# Patient Record
Sex: Female | Born: 1955 | Race: Black or African American | Hispanic: No
Health system: Southern US, Community
[De-identification: ages and names within clinical notes are randomized; demographics above are authoritative.]

## PROBLEM LIST (undated history)

## (undated) DIAGNOSIS — N289 Disorder of kidney and ureter, unspecified: Secondary | ICD-10-CM

## (undated) DIAGNOSIS — I10 Essential (primary) hypertension: Secondary | ICD-10-CM

## (undated) HISTORY — PX: BACK SURGERY: SHX140

---

## 2016-02-21 ENCOUNTER — Other Ambulatory Visit (HOSPITAL_COMMUNITY)
Admission: RE | Admit: 2016-02-21 | Discharge: 2016-02-21 | Disposition: A | Discharge status: Home / Self Care | Payer: Medicare Other | Source: Ambulatory Visit | Attending: Pathology | Admitting: Pathology

## 2016-02-21 DIAGNOSIS — Z029 Encounter for administrative examinations, unspecified: Secondary | ICD-10-CM | POA: Diagnosis present

## 2016-02-22 LAB — HEPATITIS B SURFACE ANTIGEN: Hepatitis B Surface Ag: NEGATIVE

## 2017-05-20 ENCOUNTER — Emergency Department (HOSPITAL_COMMUNITY): Payer: Medicare Other

## 2017-05-20 ENCOUNTER — Emergency Department (HOSPITAL_COMMUNITY)
Admission: EM | Admit: 2017-05-20 | Discharge: 2017-05-21 | Disposition: A | Discharge status: Home / Self Care | Payer: Medicare Other | Attending: Emergency Medicine | Admitting: Emergency Medicine

## 2017-05-20 ENCOUNTER — Encounter (HOSPITAL_COMMUNITY): Payer: Self-pay | Admitting: *Deleted

## 2017-05-20 DIAGNOSIS — R0602 Shortness of breath: Secondary | ICD-10-CM | POA: Diagnosis not present

## 2017-05-20 DIAGNOSIS — Z79899 Other long term (current) drug therapy: Secondary | ICD-10-CM | POA: Insufficient documentation

## 2017-05-20 DIAGNOSIS — R05 Cough: Secondary | ICD-10-CM | POA: Diagnosis not present

## 2017-05-20 DIAGNOSIS — I10 Essential (primary) hypertension: Secondary | ICD-10-CM | POA: Diagnosis not present

## 2017-05-20 DIAGNOSIS — R0789 Other chest pain: Secondary | ICD-10-CM

## 2017-05-20 DIAGNOSIS — R059 Cough, unspecified: Secondary | ICD-10-CM

## 2017-05-20 DIAGNOSIS — R1011 Right upper quadrant pain: Secondary | ICD-10-CM | POA: Diagnosis present

## 2017-05-20 HISTORY — DX: Disorder of kidney and ureter, unspecified: N28.9

## 2017-05-20 HISTORY — DX: Essential (primary) hypertension: I10

## 2017-05-20 LAB — COMPREHENSIVE METABOLIC PANEL
ALBUMIN: 3.4 g/dL — AB (ref 3.5–5.0)
ALT: 15 U/L (ref 14–54)
ANION GAP: 12 (ref 5–15)
AST: 25 U/L (ref 15–41)
Alkaline Phosphatase: 100 U/L (ref 38–126)
BUN: 27 mg/dL — AB (ref 6–20)
CO2: 30 mmol/L (ref 22–32)
Calcium: 7.9 mg/dL — ABNORMAL LOW (ref 8.9–10.3)
Chloride: 99 mmol/L — ABNORMAL LOW (ref 101–111)
Creatinine, Ser: 6.79 mg/dL — ABNORMAL HIGH (ref 0.44–1.00)
GFR calc Af Amer: 7 mL/min — ABNORMAL LOW (ref >60)
GFR calc non Af Amer: 6 mL/min — ABNORMAL LOW (ref >60)
GLUCOSE: 90 mg/dL (ref 65–99)
POTASSIUM: 4.1 mmol/L (ref 3.5–5.1)
SODIUM: 141 mmol/L (ref 135–145)
TOTAL PROTEIN: 6.7 g/dL (ref 6.5–8.1)
Total Bilirubin: 0.6 mg/dL (ref 0.3–1.2)

## 2017-05-20 LAB — LIPASE, BLOOD: LIPASE: 43 U/L (ref 11–51)

## 2017-05-20 LAB — CBC
HEMATOCRIT: 36.4 % (ref 36.0–46.0)
Hemoglobin: 11.4 g/dL — ABNORMAL LOW (ref 12.0–15.0)
MCH: 30.6 pg (ref 26.0–34.0)
MCHC: 31.3 g/dL (ref 30.0–36.0)
MCV: 97.8 fL (ref 78.0–100.0)
Platelets: 112 10*3/uL — ABNORMAL LOW (ref 150–400)
RBC: 3.72 MIL/uL — ABNORMAL LOW (ref 3.87–5.11)
RDW: 15.6 % — ABNORMAL HIGH (ref 11.5–15.5)
WBC: 3.9 10*3/uL — AB (ref 4.0–10.5)

## 2017-05-20 NOTE — ED Triage Notes (Addendum)
To ED for eval of right upper abd pain for the past month. Started intermittent and now constant. States she can't take a deep breath due to pain and sometimes the area feels swollen. Pt is MWF dialysis pt. She is visiting here from [facility]

## 2017-05-20 NOTE — ED Notes (Signed)
Patient transported to [facility] 

## 2017-05-20 NOTE — ED Provider Notes (Signed)
[facility]-EMERGENCY DEPT Provider Note   CSN: [id number] Arrival date & time: 05/20/17  1459     History   Chief Complaint Chief Complaint  Patient presents with  . Abdominal Pain    HPI Lynn Webb is a 61 y.o. female.  Lynn Webb is a 61 y.o. Female who presents to the emergency room complaining of right upper quadrant abdominal pain ongoing for the past month. Patient reports initially she had intermittent right upper quadrant abdominal pain and now it is constant. She reports that the area feels swollen and it hurts when she is coughing. She does reports she's had some increased coughing recently. She reports sometimes it hurts worse when she eats and sometimes it does not. She reports she had some nausea, vomiting and diarrhea about 3 days ago that resolved. She denies previous abdominal surgeries. She reports at times she feels short of breath and then this resolves. She denies any current shortness of breath. She does not feel short of breath with exertion. She denies fevers. She is a dialysis patient receives dialysis Monday, Wednesday and Friday. Last dialysis was yesterday. She is visiting from [place]. She denies fevers, vomiting, diarrhea, rashes, current shortness of breath, chest pain, palpitations, hemoptysis. She makes no urine.    The history is provided by the patient and medical records. No language interpreter was used.  Abdominal Pain   Pertinent negatives include fever, diarrhea, nausea, vomiting, constipation and headaches.    Past Medical History:  Diagnosis Date  . Hypertension   . Renal disorder     There are no active problems to display for this patient.   Past Surgical History:  Procedure Laterality Date  . BACK SURGERY      OB History    No data available       Home Medications    Prior to Admission medications   Medication Sig Start Date End Date Taking? Authorizing Provider  benzonatate (TESSALON) 100 MG capsule Take 100  mg by mouth 3 (three) times daily as needed for cough.   Yes [provider]  carvedilol (COREG) 3.125 MG tablet Take 3.125 mg by mouth 2 (two) times daily with a meal.   Yes [provider]  cinacalcet (SENSIPAR) 60 MG tablet Take 60 mg by mouth daily.   Yes [provider]  folic acid-vitamin b complex-vitamin c-selenium-zinc (DIALYVITE) 3 MG TABS tablet Take 1 tablet by mouth daily.   Yes [provider]  losartan (COZAAR) 100 MG tablet Take 100 mg by mouth daily.   Yes [provider]  omeprazole (PRILOSEC) 20 MG capsule Take 20 mg by mouth daily.   Yes [provider]  ondansetron (ZOFRAN-ODT) 4 MG disintegrating tablet Take 4 mg by mouth 2 (two) times daily as needed for nausea or vomiting.   Yes [provider]  ranitidine (ZANTAC) 150 MG tablet Take 150 mg by mouth at bedtime.   Yes [provider]    Family History No family history on file.  Social History Social History  Substance Use Topics  . Smoking status: Not on file  . Smokeless tobacco: Not on file  . Alcohol use No     Allergies   Patient has no known allergies.   Review of Systems Review of Systems  Constitutional: Negative for chills and fever.  HENT: Negative for congestion and sore throat.   Eyes: Negative for visual disturbance.  Respiratory: Positive for shortness of breath. Negative for cough and wheezing.  Cardiovascular: Negative for chest pain and palpitations.  Gastrointestinal: Positive for abdominal pain. Negative for blood in stool, constipation, diarrhea, nausea and vomiting.  Genitourinary:       Makes no urine  Musculoskeletal: Negative for back pain and neck pain.  Skin: Negative for rash.  Neurological: Negative for weakness, numbness and headaches.     Physical Exam Updated Vital Signs BP (!) 144/74   Pulse 72   Temp 98.6 F (37 C) (Oral)   Resp (!) 22   SpO2 100%   Physical Exam  Constitutional: She  appears well-developed and well-nourished. No distress.  Nontoxic appearing.  HENT:  Head: Normocephalic and atraumatic.  Mouth/Throat: Oropharynx is clear and moist.  Eyes: Pupils are equal, round, and reactive to light. Conjunctivae are normal. Right eye exhibits no discharge. Left eye exhibits no discharge.  Neck: Neck supple.  Cardiovascular: Normal rate, regular rhythm, normal heart sounds and intact distal pulses.  Exam reveals no gallop and no friction rub.   No murmur heard. Pulmonary/Chest: Effort normal and breath sounds normal. No respiratory distress. She has no wheezes. She has no rales.  Lungs are clear to ascultation bilaterally. Symmetric chest expansion bilaterally. No increased work of breathing. No rales or rhonchi.    Abdominal: Soft. Bowel sounds are normal. She exhibits no distension and no mass. There is tenderness. There is no rebound.  Abdomen is soft. Bowel sounds are present. Patient has right upper quadrant tenderness to palpation on exam. Positive Murphy sign.  Musculoskeletal: She exhibits no edema.  Lymphadenopathy:    She has no cervical adenopathy.  Neurological: She is alert. Coordination normal.  Skin: Skin is warm and dry. Capillary refill takes less than 2 seconds. No rash noted. She is not diaphoretic. No erythema. No pallor.  Psychiatric: She has a normal mood and affect. Her behavior is normal.  Nursing note and vitals reviewed.    ED Treatments / Results  Labs (all labs ordered are listed, but only abnormal results are displayed) Labs Reviewed  COMPREHENSIVE METABOLIC PANEL - Abnormal; Notable for the following:       Result Value   Chloride 99 (*)    BUN 27 (*)    Creatinine, Ser 6.79 (*)    Calcium 7.9 (*)    Albumin 3.4 (*)    GFR calc non Af Amer 6 (*)    GFR calc Af Amer 7 (*)    All other components within normal limits  CBC - Abnormal; Notable for the following:    WBC 3.9 (*)    RBC 3.72 (*)    Hemoglobin 11.4 (*)    RDW 15.6  (*)    Platelets 112 (*)    All other components within normal limits  D-DIMER, QUANTITATIVE (NOT AT [facility]) - Abnormal; Notable for the following:    D-Dimer, Quant 2.32 (*)    All other components within normal limits  LIPASE, BLOOD  I-STAT TROPONIN, ED    EKG  EKG Interpretation  Date/Time:  Tuesday May 20 2017 15:30:39 EDT Ventricular Rate:  76 PR Interval:  158 QRS Duration: 84 QT Interval:  440 QTC Calculation: 495 R Axis:   76 Text Interpretation:  Normal sinus rhythm Biatrial enlargement ST & T wave abnormality, consider inferolateral ischemia Prolonged QT Abnormal ECG no previous EKG  Confirmed by Crista Curb [phone]) on 05/20/2017 3:47:32 PM       Radiology Dg Chest 2 View  Result Date: 05/20/2017 CLINICAL DATA:  Right upper abdominal pain for a  month. Difficulty taking a deep breath due to pain. History of dialysis and hypertension. Nonsmoker. EXAM: CHEST  2 VIEW COMPARISON:  None. FINDINGS: Cardiac enlargement. No vascular congestion or edema. No focal consolidation. No blunting of costophrenic angles. No pneumothorax. Calcification of the aorta. Degenerative changes in the shoulders. IMPRESSION: Cardiac enlargement.  No evidence of active pulmonary disease. Electronically Signed   By: Burman Nieves M.D.   On: 05/20/2017 23:48   US Abdomen Limited Ruq  Result Date: 05/20/2017 CLINICAL DATA:  Right upper quadrant abdominal pain EXAM: ULTRASOUND ABDOMEN LIMITED RIGHT UPPER QUADRANT COMPARISON:  None. FINDINGS: Gallbladder: No gallstones or wall thickening visualized. No sonographic Murphy sign noted by sonographer. Common bile duct: Diameter: 3 mm Liver: No focal lesion identified. Within normal limits in parenchymal echogenicity. Portal vein is patent on color Doppler imaging with normal direction of blood flow towards the liver. IMPRESSION: Negative right upper quadrant abdominal ultrasound Electronically Signed   By: Jasmine Pang M.D.   On: 05/20/2017 23:24     Procedures Procedures (including critical care time)  Medications Ordered in ED Medications - No data to display   Initial Impression / Assessment and Plan / ED Course  I have reviewed the triage vital signs and the nursing notes.  Pertinent labs & imaging results that were available during my care of the patient were reviewed by me and considered in my medical decision making (see chart for details).     This  is a 61 y.o. Female who presents to the emergency room complaining of right upper quadrant abdominal pain ongoing for the past month. Patient reports initially she had intermittent right upper quadrant abdominal pain and now it is constant. She reports that the area feels swollen and it hurts when she is coughing. She does reports she's had some increased coughing recently. She reports sometimes it hurts worse when she eats and sometimes it does not. She reports she had some nausea, vomiting and diarrhea about 3 days ago that resolved. She denies previous abdominal surgeries. She reports at times she feels short of breath and then this resolves. She denies any current shortness of breath. She does not feel short of breath with exertion. She denies fevers. She is a dialysis patient receives dialysis Monday, Wednesday and Friday. Last dialysis was yesterday. She is visiting from [place]. On exam patient is afebrile nontoxic appearing. Her abdomen is soft and she has right upper quadrant tenderness to palpation. Lungs are clear to auscultation bilaterally. No increased work of breathing. No hypoxia. CMP is consistent with the patient in end-stage renal disease. Normal sodium and potassium. Liver enzymes are unremarkable. Lipase is within normal limits. Troponin is not elevated. EKG shows some ST and T-wave abnormalities without evidence of STEMI. No baseline to compare. Chest x-ray shows cardiac enlargement without evidence of active pulmonary disease. Right upper quadrant ultrasound  reveals no acute findings. Unsure as to the cause of this plan of her right upper quadrant pain. Concern for this possibly being a PE as patient has had some cough and some intermittent shortness of breath. Will obtain a d-dimer. D-dimer is elevated at 2.32. Will obtain a CT angiogram of her chest to rule out PE. Patient family agree with plan. At shift change patient is waiting a CT angiogram. Patient care side up to Donette Larry, MD at shift change.     Final Clinical Impressions(s) / ED Diagnoses   Final diagnoses:  Cough  SOB (shortness of breath)  RUQ abdominal pain  New Prescriptions New Prescriptions   No medications on file     Lorene Dy 05/21/17 0159    Glynn Octave, MD 05/21/17 [phone]

## 2017-05-21 ENCOUNTER — Emergency Department (HOSPITAL_COMMUNITY): Payer: Medicare Other

## 2017-05-21 DIAGNOSIS — R1011 Right upper quadrant pain: Secondary | ICD-10-CM | POA: Diagnosis not present

## 2017-05-21 LAB — I-STAT TROPONIN, ED: TROPONIN I, POC: 0.03 ng/mL (ref 0.00–0.08)

## 2017-05-21 LAB — D-DIMER, QUANTITATIVE (NOT AT ARMC): D DIMER QUANT: 2.32 ug{FEU}/mL — AB (ref 0.00–0.50)

## 2017-05-21 MED ORDER — IOPAMIDOL (ISOVUE-370) INJECTION 76%
INTRAVENOUS | Status: AC
  Administered 2017-05-21: 100 mL
  Filled 2017-05-21: qty 100

## 2017-05-21 NOTE — ED Provider Notes (Signed)
TTP R lower anterior ribs on my exam.  No RUQ pain.   CT scan is negative for pulmonary embolism or pneumonia. There is mild interstitial edema. Patient is due for dialysis at 11 AM. She has no respiratory distress. No hypoxia.  Suspect musculoskeletal chest wall pain from coughing. There is no evidence of gallstones, pneumonia or pulmonary embolism. Patient informed of abnormality in upper chest and need for repeat CT on an elective basis.  BP (!) 148/88   Pulse 77   Temp 98.6 F (37 C) (Oral)   Resp (!) 23   SpO2 97%     Glynn Octave, MD 05/21/17 [phone]

## 2017-05-21 NOTE — ED Notes (Signed)
PT states understanding of care given, follow up care. PT ambulated from ED to car with a steady gait.  

## 2017-05-21 NOTE — ED Notes (Signed)
Patient transported to CT 

## 2017-05-21 NOTE — Discharge Instructions (Signed)
There is no evidence of heart attack, pneumonia, blood clot in the lung. Take anti-inflammatories as needed for this chest wall pain. Follow-up with your doctor. You should have a repeat CT scan of your chest to make sure there are no abnormalities in her upper chest. Return to the ED if you develop new or worsening symptoms.

## 2018-05-14 IMAGING — CT CT ANGIO CHEST
2 of 6 series · 18 of 36 positions shown · IV contrast (Omni 300)
Comparison: None.

CLINICAL DATA: Right-sided chest pain and shortness of breath.
History of hypertension and dialysis. Positive D-dimer

EXAM:
CT ANGIOGRAPHY CHEST WITH CONTRAST
TECHNIQUE: Multidetector CT imaging of the chest was performed using the
standard protocol during bolus administration of intravenous
contrast. Multiplanar CT image reconstructions and MIPs were
obtained to evaluate the vascular anatomy.
CONTRAST:  78 mL Isovue 370

[Series 7: pe thins · axial · 0.85mm/px · z∈[+714,+942]mm · 17 of 258 slices shown]
[im 15/258  lung]
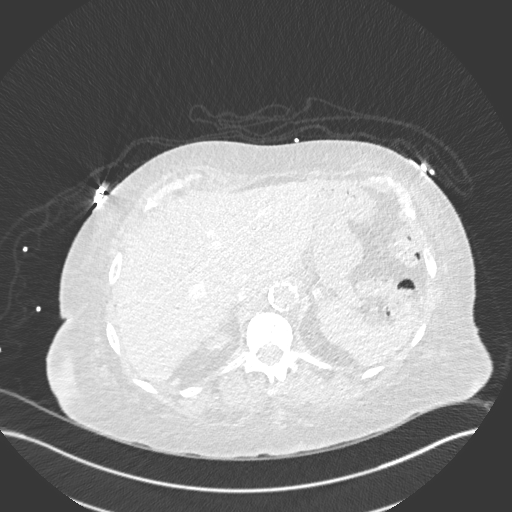
[im 29/258  mediastinal]
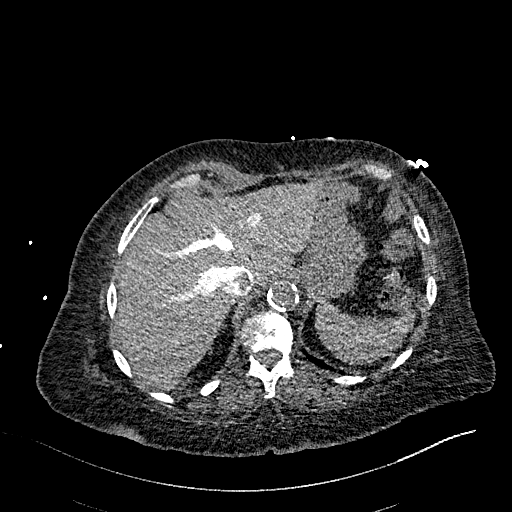
[im 43/258  lung]
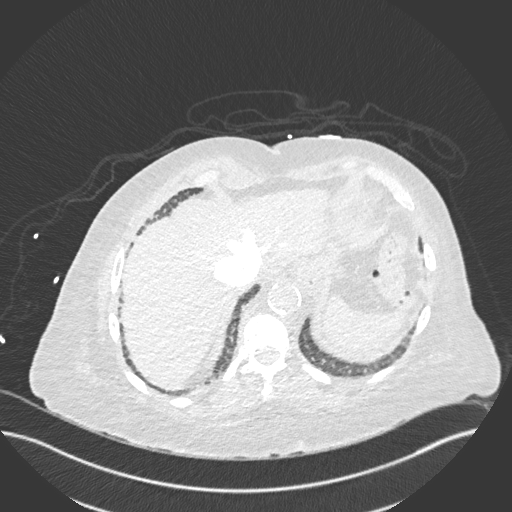
[im 58/258  mediastinal]
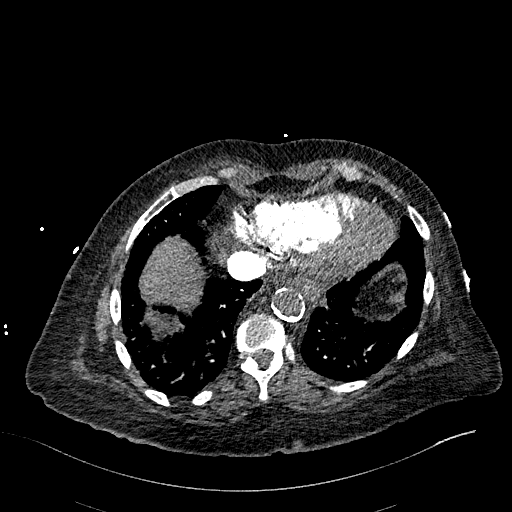
[im 72/258  lung]
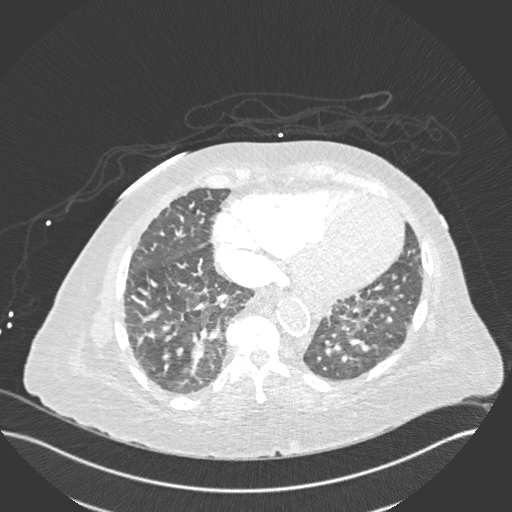
[im 86/258  mediastinal]
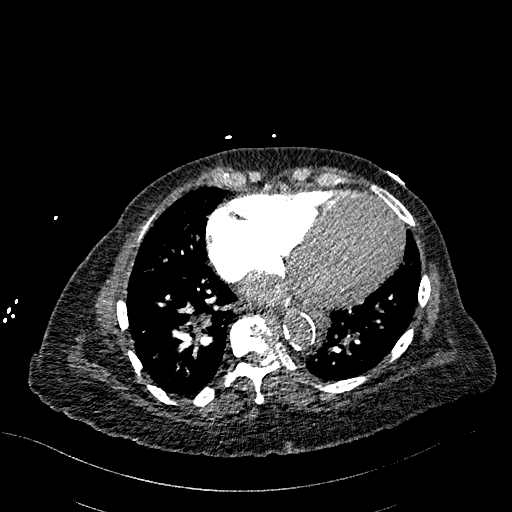
[im 100/258  lung]
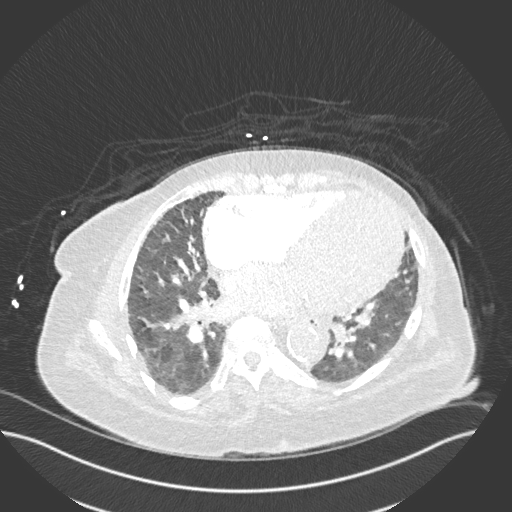
[im 115/258  mediastinal]
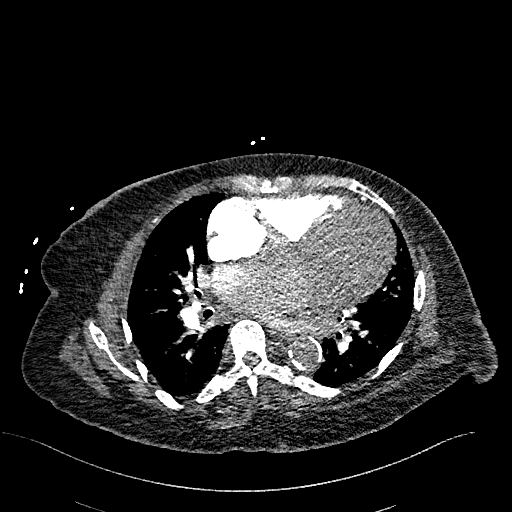
[im 129/258  lung]
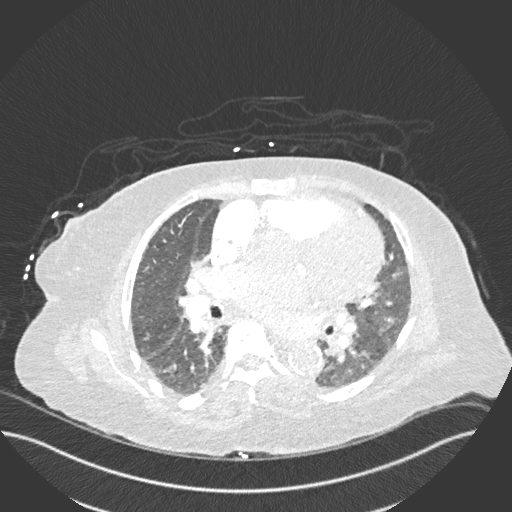
[im 143/258  mediastinal]
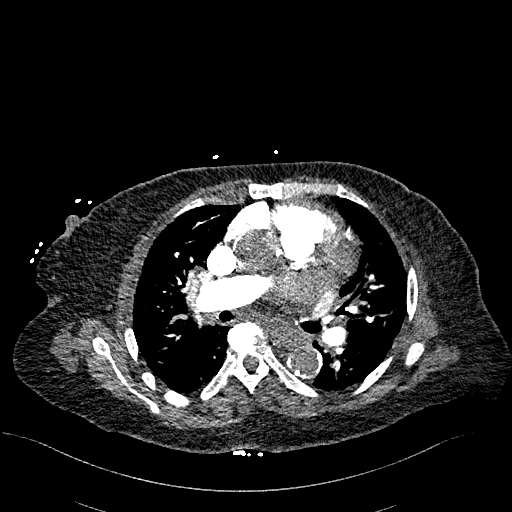
[im 158/258  lung]
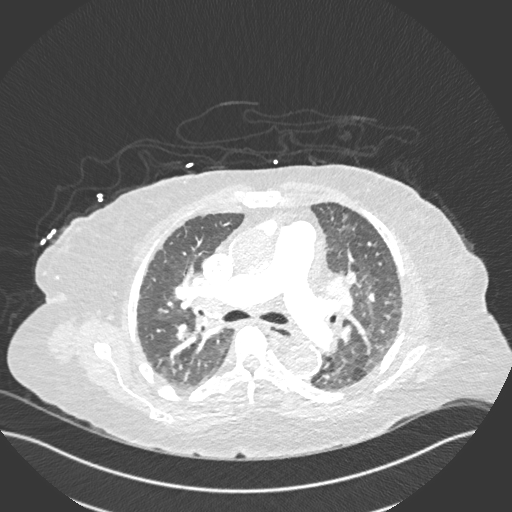
[im 172/258  mediastinal]
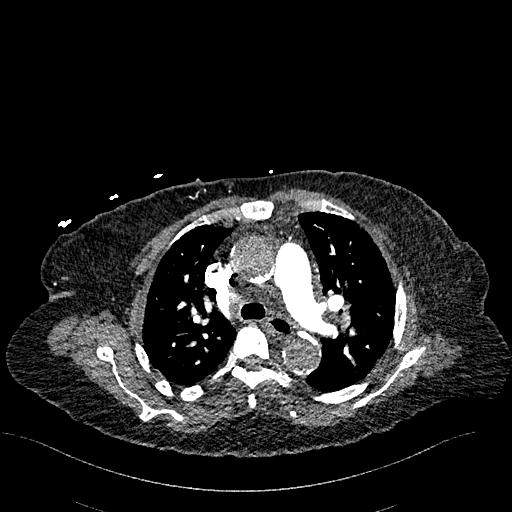
[im 186/258  lung]
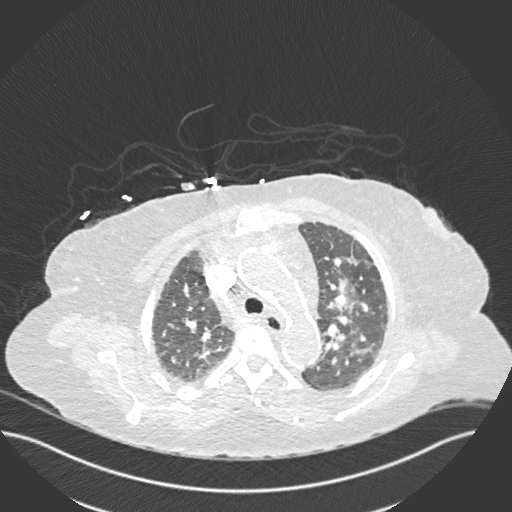
[im 200/258  mediastinal]
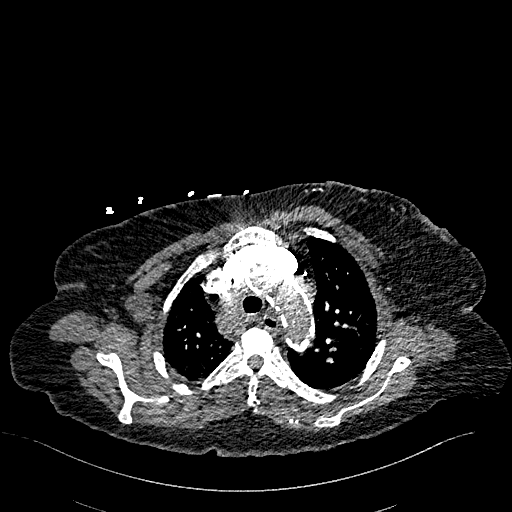
[im 215/258  lung]
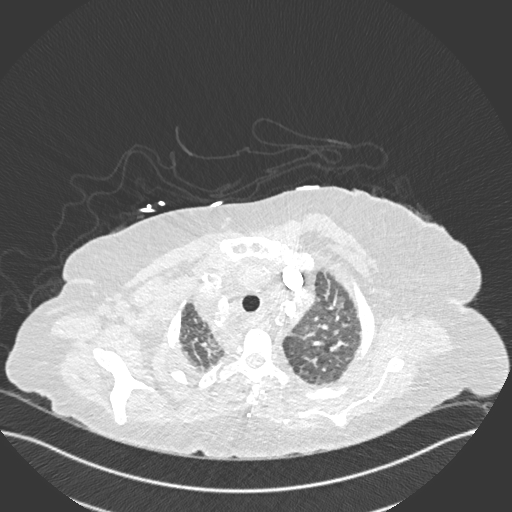
[im 229/258  mediastinal]
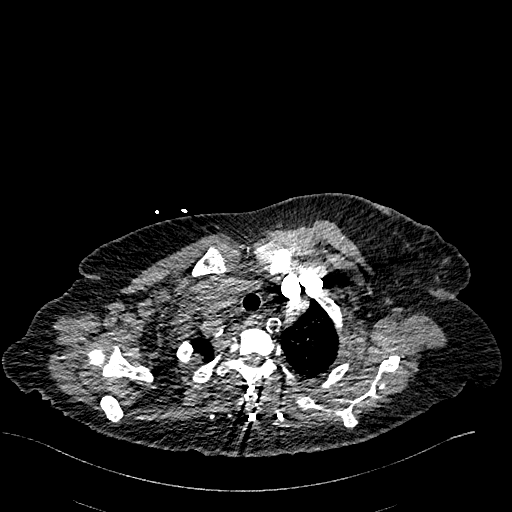
[im 243/258  lung]
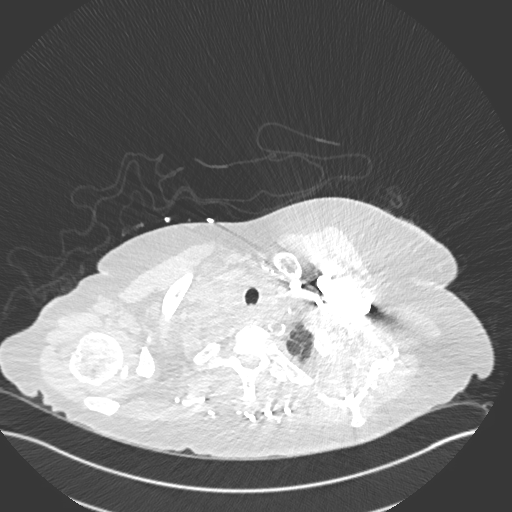

[Series 8: pe 2mm cor · coronal · 0.51mm/px · 1 of 118 slices shown]
[im 59/118  mediastinal]
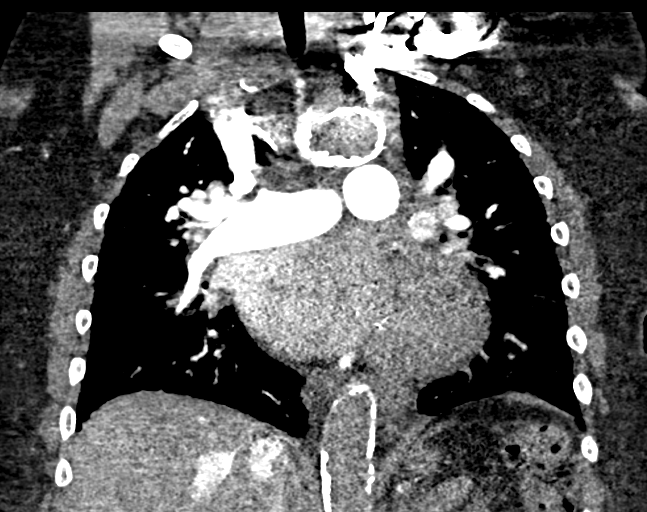

[18 of 36 positions shown; findings below may reference images not displayed]

FINDINGS: Cardiovascular: Good opacification of the central and segmental
pulmonary arteries. No filling defects identified. No evidence of
significant pulmonary embolus. Prominent enlargement of the heart.
Reflux of contrast material into the hepatic veins suggest right
heart failure. Diffuse calcification of the thoracic aorta and great
vessels. No aneurysm. Coronary artery calcification.

Mediastinum/Nodes: There is prominent soft tissue density in the
thoracic inlet and right paratracheal region. Visualization in this
area is limited due to technical factors. Can't exclude
lymphadenopathy or right apical mass. This could also represent
prominent venous vascularity or prominent mediastinal fat. This
looks to be remote from the thyroid but thyroid goiter could also
have this appearance. The esophagus is decompressed.

Lungs/Pleura: Evaluation is limited due to respiratory motion.
Mosaic attenuation pattern to the lungs with interstitial thickening
in the bases likely represents edema. No consolidation or volume
loss. Airways appear patent. No pleural effusions. No pneumothorax.

Upper Abdomen: Vascular calcifications. No discrete evidence of any
acute abnormality.

Musculoskeletal: Diffuse heterogeneous bone sclerosis suggesting
renal osteodystrophy in a dialysis patient. Diffuse sclerotic
metastatic disease is not excluded although less likely given the
history.

Review of the MIP images confirms the above findings.
IMPRESSION: 1. No evidence of significant pulmonary embolus.
2. Cardiac enlargement with pulmonary vascular congestion and
probable edema.
3. Indeterminate soft tissue density in the thoracic inlet and right
peritracheal region. Evaluation of this area is limited due to
technical factors. Differential diagnosis would include mass,
lymphadenopathy, or prominent vascularity or fat. A follow-up
routine contrast-enhanced chest CT after resolution of the acute
process and when patient is better able to hold breath may be useful
in further evaluation.
4. Aortic atherosclerosis.

Aortic Atherosclerosis ([id number]-[id number]).

## 2018-07-03 DEATH — deceased
